# Patient Record
Sex: Female | Born: 1965 | Race: White | Hispanic: No | Marital: Single | Smoking: Current every day smoker
Health system: Southern US, Community
[De-identification: ages and names within clinical notes are randomized; demographics above are authoritative.]

## PROBLEM LIST (undated history)

## (undated) DIAGNOSIS — N200 Calculus of kidney: Secondary | ICD-10-CM

## (undated) DIAGNOSIS — J42 Unspecified chronic bronchitis: Secondary | ICD-10-CM

## (undated) DIAGNOSIS — J449 Chronic obstructive pulmonary disease, unspecified: Secondary | ICD-10-CM

## (undated) DIAGNOSIS — J189 Pneumonia, unspecified organism: Secondary | ICD-10-CM

## (undated) HISTORY — PX: LITHOTRIPSY: SUR834

## (undated) HISTORY — PX: ABDOMINAL HYSTERECTOMY: SHX81

---

## 2011-01-07 ENCOUNTER — Emergency Department (HOSPITAL_BASED_OUTPATIENT_CLINIC_OR_DEPARTMENT_OTHER)
Admission: EM | Admit: 2011-01-07 | Discharge: 2011-01-07 | Disposition: A | Discharge status: Home / Self Care | Payer: Self-pay | Attending: Emergency Medicine | Admitting: Emergency Medicine

## 2011-01-07 DIAGNOSIS — J449 Chronic obstructive pulmonary disease, unspecified: Secondary | ICD-10-CM | POA: Insufficient documentation

## 2011-01-07 DIAGNOSIS — J4489 Other specified chronic obstructive pulmonary disease: Secondary | ICD-10-CM | POA: Insufficient documentation

## 2011-01-07 DIAGNOSIS — F17200 Nicotine dependence, unspecified, uncomplicated: Secondary | ICD-10-CM | POA: Insufficient documentation

## 2011-01-07 DIAGNOSIS — L02419 Cutaneous abscess of limb, unspecified: Secondary | ICD-10-CM | POA: Insufficient documentation

## 2011-01-07 DIAGNOSIS — L03119 Cellulitis of unspecified part of limb: Secondary | ICD-10-CM | POA: Insufficient documentation

## 2012-07-29 ENCOUNTER — Emergency Department (HOSPITAL_BASED_OUTPATIENT_CLINIC_OR_DEPARTMENT_OTHER)

## 2012-07-29 ENCOUNTER — Encounter (HOSPITAL_BASED_OUTPATIENT_CLINIC_OR_DEPARTMENT_OTHER): Payer: Self-pay

## 2012-07-29 ENCOUNTER — Emergency Department (HOSPITAL_BASED_OUTPATIENT_CLINIC_OR_DEPARTMENT_OTHER)
Admission: EM | Admit: 2012-07-29 | Discharge: 2012-07-29 | Disposition: A | Discharge status: Home / Self Care | Attending: Emergency Medicine | Admitting: Emergency Medicine

## 2012-07-29 DIAGNOSIS — F17200 Nicotine dependence, unspecified, uncomplicated: Secondary | ICD-10-CM | POA: Insufficient documentation

## 2012-07-29 DIAGNOSIS — Z880 Allergy status to penicillin: Secondary | ICD-10-CM | POA: Insufficient documentation

## 2012-07-29 DIAGNOSIS — J40 Bronchitis, not specified as acute or chronic: Secondary | ICD-10-CM | POA: Insufficient documentation

## 2012-07-29 DIAGNOSIS — Z882 Allergy status to sulfonamides status: Secondary | ICD-10-CM | POA: Insufficient documentation

## 2012-07-29 HISTORY — DX: Unspecified chronic bronchitis: J42

## 2012-07-29 HISTORY — DX: Pneumonia, unspecified organism: J18.9

## 2012-07-29 MED ORDER — DEXAMETHASONE SODIUM PHOSPHATE 4 MG/ML IJ SOLN
10.0000 mg | Freq: Once | INTRAMUSCULAR | Status: AC
  Administered 2012-07-29: 10 mg via INTRAMUSCULAR
  Filled 2012-07-29: qty 3

## 2012-07-29 NOTE — ED Notes (Signed)
Prod cough "watery substance", wheezing x 4 days-was seen at [facility] ED 2 days ago-started on abx, inhaler, cough med-states she is no better

## 2012-07-29 NOTE — ED Provider Notes (Signed)
History     CSN: [id number]  Arrival date & time 07/29/12  1202   First MD Initiated Contact with Patient 07/29/12 1216      Chief Complaint  Patient presents with  . Cough    Patient went to the doctor the other day and got placed on a Z-Pak has been taking at  if not improved. Patient is a 46 y.o. female presenting with cough. The history is provided by the patient.  Cough The current episode started more than 2 days ago. The problem occurs every few minutes. The problem has not changed since onset.The cough is productive of sputum. There has been no fever. Pertinent negatives include no chest pain, no headaches and no myalgias. She has tried cough syrup for the symptoms. The treatment provided no relief. Her past medical history is significant for bronchitis. Her past medical history does not include pneumonia or emphysema.    Past Medical History  Diagnosis Date  . Bronchitis, chronic   . Pneumonia     Past Surgical History  Procedure Date  . Abdominal hysterectomy   . Cesarean section     No family history on file.  History  Substance Use Topics  . Smoking status: Current Every Day Smoker  . Smokeless tobacco: Not on file  . Alcohol Use: No    OB History    Grav Para Term Preterm Abortions TAB SAB Ect Mult Living                  Review of Systems  Respiratory: Positive for cough.   Cardiovascular: Negative for chest pain.  Musculoskeletal: Negative for myalgias.  Neurological: Negative for headaches.    Allergies  Penicillins and Sulfa antibiotics  Home Medications   Current Outpatient Rx  Name Route Sig Dispense Refill  . ALBUTEROL SULFATE IN Inhalation Inhale into the lungs.    . AZITHROMYCIN PO Oral Take by mouth.    Marland Kitchen HYDROCODONE-HOMATROPINE 5-1.5 MG/5ML PO SYRP Oral Take by mouth every 6 (six) hours as needed.      BP 128/81  Pulse 108  Temp 98.6 F (37 C) (Oral)  Resp 18  Ht 5\' 7"  (1.702 m)  Wt 175 lb (79.379 kg)  BMI 27.41 kg/m2   SpO2 93%  Physical Exam  Nursing note and vitals reviewed. Constitutional: She is oriented to person, place, and time. She appears well-developed. No distress.  HENT:  Head: Normocephalic and atraumatic.  Eyes: Pupils are equal, round, and reactive to light.  Neck: Normal range of motion.  Cardiovascular: Normal rate and intact distal pulses.   Pulmonary/Chest: No respiratory distress. She has wheezes.  Abdominal: Normal appearance. She exhibits no distension.  Musculoskeletal: Normal range of motion.  Neurological: She is alert and oriented to person, place, and time. No cranial nerve deficit.  Skin: Skin is warm and dry. No rash noted.  Psychiatric: She has a normal mood and affect. Her behavior is normal.    ED Course  Procedures (including critical care time)   Scheduled Meds:   . dexamethasone  10 mg Intramuscular Once   Continuous Infusions:  PRN Meds:.  Labs Reviewed - No data to display Dg Chest 2 View  07/29/2012  *RADIOLOGY REPORT*  Clinical Data: Cough, congestion.  CHEST - 2 VIEW  Comparison: None  Findings: Lungs hyperinflated. Heart and mediastinal contours are within normal limits.  No focal opacities or effusions.  No acute bony abnormality.  IMPRESSION: Mild hyperinflation.  No active disease.   Original  Report Authenticated By: Cyndie Chime, M.D.      1. Bronchitis       MDM         Nelia Shi, MD 07/29/12 [phone]

## 2013-08-07 ENCOUNTER — Emergency Department (HOSPITAL_BASED_OUTPATIENT_CLINIC_OR_DEPARTMENT_OTHER)
Admission: EM | Admit: 2013-08-07 | Discharge: 2013-08-07 | Disposition: A | Discharge status: Home / Self Care | Attending: Emergency Medicine | Admitting: Emergency Medicine

## 2013-08-07 ENCOUNTER — Encounter (HOSPITAL_BASED_OUTPATIENT_CLINIC_OR_DEPARTMENT_OTHER): Payer: Self-pay | Admitting: Emergency Medicine

## 2013-08-07 DIAGNOSIS — M545 Low back pain, unspecified: Secondary | ICD-10-CM | POA: Insufficient documentation

## 2013-08-07 DIAGNOSIS — R109 Unspecified abdominal pain: Secondary | ICD-10-CM | POA: Insufficient documentation

## 2013-08-07 DIAGNOSIS — F17200 Nicotine dependence, unspecified, uncomplicated: Secondary | ICD-10-CM | POA: Insufficient documentation

## 2013-08-07 DIAGNOSIS — J029 Acute pharyngitis, unspecified: Secondary | ICD-10-CM | POA: Insufficient documentation

## 2013-08-07 DIAGNOSIS — Z880 Allergy status to penicillin: Secondary | ICD-10-CM | POA: Insufficient documentation

## 2013-08-07 DIAGNOSIS — Z8701 Personal history of pneumonia (recurrent): Secondary | ICD-10-CM | POA: Insufficient documentation

## 2013-08-07 DIAGNOSIS — Z792 Long term (current) use of antibiotics: Secondary | ICD-10-CM | POA: Insufficient documentation

## 2013-08-07 DIAGNOSIS — H9209 Otalgia, unspecified ear: Secondary | ICD-10-CM | POA: Insufficient documentation

## 2013-08-07 DIAGNOSIS — H9203 Otalgia, bilateral: Secondary | ICD-10-CM

## 2013-08-07 DIAGNOSIS — Z90710 Acquired absence of both cervix and uterus: Secondary | ICD-10-CM | POA: Insufficient documentation

## 2013-08-07 LAB — URINALYSIS, ROUTINE W REFLEX MICROSCOPIC
Leukocytes, UA: NEGATIVE
Nitrite: NEGATIVE
Specific Gravity, Urine: 1.016 (ref 1.005–1.030)
Urobilinogen, UA: 0.2 mg/dL (ref 0.0–1.0)

## 2013-08-07 NOTE — ED Provider Notes (Addendum)
CSN: [id number]     Arrival date & time 08/07/13  1751 History   First MD Initiated Contact with Patient 08/07/13 1806     This chart was scribed for Doug Sou, MD by Ladona Ridgel Day, ED scribe. This patient was seen in room MH10/MH10 and the patient's care was started at 1751.  Chief Complaint  Patient presents with  . Otalgia  . Flank Pain   Patient is a 47 y.o. female presenting with ear pain and flank pain. The history is provided by the patient. No language interpreter was used.  Otalgia Location:  Bilateral Behind ear:  No abnormality Quality:  Aching Severity:  Moderate Duration:  2 weeks Timing:  Intermittent Progression:  Unchanged Context: not direct blow   Relieved by:  Nothing Worsened by:  Nothing tried Associated symptoms: abdominal pain (abdominal cramping) and sore throat   Associated symptoms: no fever and no vomiting   Flank Pain Associated symptoms include abdominal pain (abdominal cramping). Pertinent negatives include no shortness of breath.   HPI Comments: Kirsten Garcia is a 47 y.o. female who presents to the Emergency Department complaining of intermittent bilateral ear pain for 2 weeks. She states the pain radiates the right anterior aspect of her neck and also w/a mildly sore throat; no pain w/swallowing. She has not yet seen anyone for this problem. She denies associated emesis episodes. She also c/o right lower back pain, nothing make pain worse but she stats tylenol is controlling her pain symptoms.  She states cramping in her abdomen, she has prev hysterectomy. She denies fever/chills, SOB, CP. She states med hx of pneumonia, hysterectomy, bronchitis, bronchal asthma, kidney stone, c-section She is a smoker and baseline smokers cough, no ETOH, no illicit drug use. She uses Albuterol for bronchitis Allergic to PCN and sulfa PCP [facility]  Past Medical History  Diagnosis Date  . Bronchitis, chronic   . Pneumonia    Past Surgical History   Procedure Laterality Date  . Abdominal hysterectomy    . Cesarean section     History reviewed. No pertinent family history. History  Substance Use Topics  . Smoking status: Current Every Day Smoker  . Smokeless tobacco: Not on file  . Alcohol Use: No   OB History   Grav Para Term Preterm Abortions TAB SAB Ect Mult Living                 Review of Systems  Constitutional: Negative.  Negative for fever and chills.  HENT: Positive for ear pain and sore throat. Negative for trouble swallowing.   Respiratory: Negative.  Negative for shortness of breath.   Cardiovascular: Negative.   Gastrointestinal: Positive for abdominal pain (abdominal cramping). Negative for nausea and vomiting.  Genitourinary: Positive for flank pain.  Musculoskeletal: Negative.        Right flank pain.   Skin: Negative.   Neurological: Negative.  Negative for weakness.  Psychiatric/Behavioral: Negative.   All other systems reviewed and are negative.   A complete 10 system review of systems was obtained and all systems are negative except as noted in the HPI and PMH.   Allergies  Penicillins and Sulfa antibiotics  Home Medications   Current Outpatient Rx  Name  Route  Sig  Dispense  Refill  . ALBUTEROL SULFATE IN   Inhalation   Inhale into the lungs.         . AZITHROMYCIN PO   Oral   Take by mouth.         Marland Kitchen  HYDROcodone-homatropine (HYCODAN) 5-1.5 MG/5ML syrup   Oral   Take by mouth every 6 (six) hours as needed.          Triage Vitals: BP 143/89  Pulse 91  Temp(Src) 98.1 F (36.7 C) (Oral)  Resp 16  SpO2 100% Physical Exam  Nursing note and vitals reviewed. Constitutional: She is oriented to person, place, and time. She appears well-developed and well-nourished. No distress.  HENT:  Head: Normocephalic and atraumatic.  Throat minimally erythematous.   Eyes: Conjunctivae and EOM are normal. Pupils are equal, round, and reactive to light.  Neck: Neck supple. No tracheal  deviation present. No thyromegaly present.  Cardiovascular: Normal rate and regular rhythm.   No murmur heard. Pulmonary/Chest: Effort normal. No respiratory distress.  Scant rhonchi.   Abdominal: Soft. Bowel sounds are normal. She exhibits no distension. There is no tenderness.  Musculoskeletal: Normal range of motion. She exhibits no edema and no tenderness.  Neurological: She is alert and oriented to person, place, and time. Coordination normal.  Skin: Skin is warm and dry. No rash noted.  Psychiatric: She has a normal mood and affect. Her behavior is normal.    ED Course  Procedures (including critical care time) DIAGNOSTIC STUDIES: Oxygen Saturation is 100% on room air, normal by my interpretation.    COORDINATION OF CARE: At 640 PM Discussed treatment plan with patient which includes UA. Patient agrees.   Labs Review Labs Reviewed  URINALYSIS, ROUTINE W REFLEX MICROSCOPIC   Results for orders placed during the hospital encounter of 08/07/13  URINALYSIS, ROUTINE W REFLEX MICROSCOPIC      Result Value Range   Color, Urine YELLOW  YELLOW   APPearance CLEAR  CLEAR   Specific Gravity, Urine 1.016  1.005 - 1.030   pH 6.0  5.0 - 8.0   Glucose, UA NEGATIVE  NEGATIVE mg/dL   Hgb urine dipstick NEGATIVE  NEGATIVE   Bilirubin Urine NEGATIVE  NEGATIVE   Ketones, ur NEGATIVE  NEGATIVE mg/dL   Protein, ur NEGATIVE  NEGATIVE mg/dL   Urobilinogen, UA 0.2  0.0 - 1.0 mg/dL   Nitrite NEGATIVE  NEGATIVE   Leukocytes, UA NEGATIVE  NEGATIVE   No results found.  Imaging Review No results found.  MDM  No diagnosis found.  Patient in no distress, well appearing. Symptoms may be viral in etiology with mild pharyngeal erythema. Abdominal exam completely benign. Plan Tylenol for pain followup PMD if not better in a few days Diagnosis #1 otalgia #2 flank pain I personally performed the services described in this documentation, which was scribed in my presence. The recorded information has  been reviewed and considered.    Doug Sou, MD 08/07/13 1610  Doug Sou, MD 08/07/13 [id number]

## 2013-08-07 NOTE — ED Notes (Signed)
Pain in ears bilaterally for couple of weeks, states now pain moving down neck and headache, also c/o right flank pain

## 2013-09-28 ENCOUNTER — Encounter (HOSPITAL_COMMUNITY): Payer: Self-pay | Admitting: Emergency Medicine

## 2013-09-28 ENCOUNTER — Emergency Department (HOSPITAL_COMMUNITY)

## 2013-09-28 ENCOUNTER — Emergency Department (HOSPITAL_COMMUNITY)
Admission: EM | Admit: 2013-09-28 | Discharge: 2013-09-28 | Disposition: A | Discharge status: Home / Self Care | Attending: Emergency Medicine | Admitting: Emergency Medicine

## 2013-09-28 DIAGNOSIS — R062 Wheezing: Secondary | ICD-10-CM | POA: Insufficient documentation

## 2013-09-28 DIAGNOSIS — Z882 Allergy status to sulfonamides status: Secondary | ICD-10-CM | POA: Insufficient documentation

## 2013-09-28 DIAGNOSIS — R0682 Tachypnea, not elsewhere classified: Secondary | ICD-10-CM | POA: Insufficient documentation

## 2013-09-28 DIAGNOSIS — Z79899 Other long term (current) drug therapy: Secondary | ICD-10-CM | POA: Insufficient documentation

## 2013-09-28 DIAGNOSIS — Z8709 Personal history of other diseases of the respiratory system: Secondary | ICD-10-CM | POA: Insufficient documentation

## 2013-09-28 DIAGNOSIS — J4489 Other specified chronic obstructive pulmonary disease: Secondary | ICD-10-CM | POA: Insufficient documentation

## 2013-09-28 DIAGNOSIS — F17200 Nicotine dependence, unspecified, uncomplicated: Secondary | ICD-10-CM | POA: Insufficient documentation

## 2013-09-28 DIAGNOSIS — R0609 Other forms of dyspnea: Secondary | ICD-10-CM | POA: Insufficient documentation

## 2013-09-28 DIAGNOSIS — R059 Cough, unspecified: Secondary | ICD-10-CM | POA: Insufficient documentation

## 2013-09-28 DIAGNOSIS — Z8701 Personal history of pneumonia (recurrent): Secondary | ICD-10-CM | POA: Insufficient documentation

## 2013-09-28 DIAGNOSIS — Z880 Allergy status to penicillin: Secondary | ICD-10-CM | POA: Insufficient documentation

## 2013-09-28 DIAGNOSIS — R000 Tachycardia, unspecified: Secondary | ICD-10-CM | POA: Insufficient documentation

## 2013-09-28 DIAGNOSIS — J449 Chronic obstructive pulmonary disease, unspecified: Secondary | ICD-10-CM | POA: Insufficient documentation

## 2013-09-28 DIAGNOSIS — R079 Chest pain, unspecified: Secondary | ICD-10-CM | POA: Insufficient documentation

## 2013-09-28 DIAGNOSIS — R05 Cough: Secondary | ICD-10-CM | POA: Insufficient documentation

## 2013-09-28 DIAGNOSIS — R0600 Dyspnea, unspecified: Secondary | ICD-10-CM

## 2013-09-28 DIAGNOSIS — R9431 Abnormal electrocardiogram [ECG] [EKG]: Secondary | ICD-10-CM | POA: Insufficient documentation

## 2013-09-28 DIAGNOSIS — R0989 Other specified symptoms and signs involving the circulatory and respiratory systems: Secondary | ICD-10-CM | POA: Insufficient documentation

## 2013-09-28 HISTORY — DX: Chronic obstructive pulmonary disease, unspecified: J44.9

## 2013-09-28 LAB — POCT I-STAT TROPONIN I: Troponin i, poc: 0 ng/mL (ref 0.00–0.08)

## 2013-09-28 LAB — D-DIMER, QUANTITATIVE: D-Dimer, Quant: 0.36 ug/mL-FEU (ref 0.00–0.48)

## 2013-09-28 LAB — BASIC METABOLIC PANEL
CO2: 26 mEq/L (ref 19–32)
Calcium: 9.7 mg/dL (ref 8.4–10.5)
Chloride: 103 mEq/L (ref 96–112)
Potassium: 4.1 mEq/L (ref 3.5–5.1)
Sodium: 139 mEq/L (ref 135–145)

## 2013-09-28 LAB — CBC
Platelets: 230 10*3/uL (ref 150–400)
RBC: 5.42 MIL/uL — ABNORMAL HIGH (ref 3.87–5.11)
WBC: 8.3 10*3/uL (ref 4.0–10.5)

## 2013-09-28 MED ORDER — ALBUTEROL SULFATE (5 MG/ML) 0.5% IN NEBU
5.0000 mg | INHALATION_SOLUTION | Freq: Once | RESPIRATORY_TRACT | Status: AC
  Administered 2013-09-28: 5 mg via RESPIRATORY_TRACT
  Filled 2013-09-28: qty 1

## 2013-09-28 MED ORDER — ALBUTEROL (5 MG/ML) CONTINUOUS INHALATION SOLN
10.0000 mg/h | INHALATION_SOLUTION | RESPIRATORY_TRACT | Status: AC
  Administered 2013-09-28: 10 mg/h via RESPIRATORY_TRACT
  Filled 2013-09-28: qty 20

## 2013-09-28 MED ORDER — PREDNISONE 10 MG PO TABS: 40.0000 mg | ORAL_TABLET | Freq: Every day | ORAL | Status: AC

## 2013-09-28 MED ORDER — ALBUTEROL SULFATE HFA 108 (90 BASE) MCG/ACT IN AERS: 2.0000 | INHALATION_SPRAY | RESPIRATORY_TRACT | Status: AC

## 2013-09-28 MED ORDER — METHYLPREDNISOLONE SODIUM SUCC 125 MG IJ SOLR
125.0000 mg | INTRAMUSCULAR | Status: AC
  Administered 2013-09-28: 125 mg via INTRAVENOUS
  Filled 2013-09-28: qty 2

## 2013-09-28 NOTE — ED Notes (Signed)
Patient transported to X-ray 

## 2013-09-28 NOTE — ED Notes (Signed)
Pt returned to exam room from xray 

## 2013-09-28 NOTE — ED Provider Notes (Signed)
CSN: [id number]     Arrival date & time 09/28/13  4098 History   First MD Initiated Contact with Patient 09/28/13 [phone]     Chief Complaint  Patient presents with  . Shortness of Breath   (Consider location/radiation/quality/duration/timing/severity/associated sxs/prior Treatment) HPI Patient presents with dyspnea, cough.  Notably, the patient initiated treatment 1 week ago for an episode of bronchitis.  She states that she has a history of chronic bronchitis, continue to smoke regardless. She states over the past day she has had increasing cough, increasing dyspnea with chest pain associated with coughing. No no syncope, vomiting, diarrhea, abdominal pain, nausea. No confusion, no disorientation, no rash, no edema anywhere.  Past Medical History  Diagnosis Date  . Bronchitis, chronic   . Pneumonia   . COPD (chronic obstructive pulmonary disease)    Past Surgical History  Procedure Laterality Date  . Abdominal hysterectomy    . Cesarean section     History reviewed. No pertinent family history. History  Substance Use Topics  . Smoking status: Current Every Day Smoker  . Smokeless tobacco: Not on file  . Alcohol Use: No   OB History   Grav Para Term Preterm Abortions TAB SAB Ect Mult Living                 Review of Systems  Constitutional:       Per HPI, otherwise negative  HENT:       Per HPI, otherwise negative  Respiratory:       Per HPI, otherwise negative  Cardiovascular:       Per HPI, otherwise negative  Gastrointestinal: Negative for vomiting.  Endocrine:       Negative aside from HPI  Genitourinary:       Neg aside from HPI   Musculoskeletal:       Per HPI, otherwise negative  Skin: Negative.   Neurological: Negative for syncope.    Allergies  Penicillins and Sulfa antibiotics  Home Medications   Current Outpatient Rx  Name  Route  Sig  Dispense  Refill  . albuterol (PROAIR HFA) 108 (90 BASE) MCG/ACT inhaler   Inhalation   Inhale 2 puffs into  the lungs every 6 (six) hours as needed for wheezing or shortness of breath (wheezing).         Marland Kitchen HYDROcodone-homatropine (HYCODAN) 5-1.5 MG/5ML syrup   Oral   Take by mouth every 6 (six) hours as needed.         . PrednisoLONE 5 MG KIT   Oral   Take by mouth. Pt finished on 09-26-13 for 6 days          BP 119/77  Pulse 76  Temp(Src) 98.3 F (36.8 C) (Oral)  Resp 18  Ht 5\' 7"  (1.702 m)  Wt 188 lb 12.8 oz (85.639 kg)  BMI 29.56 kg/m2  SpO2 94% Physical Exam  Nursing note and vitals reviewed. Constitutional: She is oriented to person, place, and time. She appears well-developed and well-nourished. No distress.  HENT:  Head: Normocephalic and atraumatic.  Eyes: Conjunctivae and EOM are normal.  Cardiovascular: Regular rhythm.  Tachycardia present.   Pulmonary/Chest: No stridor. Tachypnea noted. No respiratory distress. She has wheezes.  Abdominal: She exhibits no distension.  Musculoskeletal: She exhibits no edema and no tenderness.  Neurological: She is alert and oriented to person, place, and time. No cranial nerve deficit.  Skin: Skin is warm and dry.  Psychiatric: She has a normal mood and affect.    ED  Course  Procedures (including critical care time) Labs Review Labs Reviewed  CBC - Abnormal; Notable for the following:    RBC 5.42 (*)    Hemoglobin 16.9 (*)    HCT 48.9 (*)    All other components within normal limits  BASIC METABOLIC PANEL - Abnormal; Notable for the following:    GFR calc non Af Amer 72 (*)    GFR calc Af Amer 84 (*)    All other components within normal limits  D-DIMER, QUANTITATIVE  POCT I-STAT TROPONIN I   Imaging Review Dg Chest 2 View  09/28/2013   CLINICAL DATA:  Cough and shortness of breath.  EXAM: CHEST  2 VIEW  COMPARISON:  09/21/2013  FINDINGS: There has been marked decrease conspicuity of the previously described interstitial findings. No new focal reason consolidation are new focal infiltrates. Cardiac silhouette visualized  bony skeleton unremarkable. The lungs are hyperinflated.  IMPRESSION: 1. Findings consistent with resolving previously described interstitial changes. 2. Continued hyperinflation 3. No focal regions of consolidation or focal infiltrates   Electronically Signed   By: Salome Holmes M.D.   On: 09/28/2013 10:23    EKG Interpretation    Date/Time:  Tuesday September 28 2013 09:25:05 EST Ventricular Rate:  115 PR Interval:  130 QRS Duration: 72 QT Interval:  318 QTC Calculation: 439 R Axis:   62 Text Interpretation:  Sinus tachycardia Biatrial enlargement Abnormal ECG Sinus tachycardia enlarged p waves Abnormal ekg Confirmed by Gerhard Munch  MD [phone]) on 09/28/2013 11:09:28 AM           Update: Patient is now feeling better.  I discussed the results with her.  She'll have additional breathing treatment, posterior, be reassessed  12:19 PM Patient states that she is better. BS remain coarse.  Continuous ordered  2:39 PM Following continuous nebulizer, the patient looks on for substantially better, but not to without wheezing.  We had a lengthy conversation about utility of additional therapy here, admission versus outpatient management.  Patient requested a preference for outpatient management.  Given her stable vital signs, this seems reasonable MDM  No diagnosis found. Patient presents with dyspnea following recent episode of proctitis, possible URI.  On exam she is awake alert, afebrile, tachycardic, with coarse breath sounds.  Patient improved substantially here.  With ongoing mild wheezing, her history, her continued use of cigarettes, she was discharged with scheduled albuterol therapy, steroids.  Patient was counseled multiple times on the need to stop smoking.    Gerhard Munch, MD 09/28/13 [phone]

## 2013-09-28 NOTE — ED Notes (Signed)
Pt reports her doctor placed on zpak and steroid last week for cough and bronchitis. She finished the full course but continues to feel SOB with a cough and "today it felt like my heart was racing and I feel numb in my back." pt is able to speak in full unlabored sentences but does have a rhoncorus cough.

## 2014-05-06 ENCOUNTER — Encounter (HOSPITAL_BASED_OUTPATIENT_CLINIC_OR_DEPARTMENT_OTHER): Payer: Self-pay | Admitting: Emergency Medicine

## 2014-05-06 ENCOUNTER — Emergency Department (HOSPITAL_BASED_OUTPATIENT_CLINIC_OR_DEPARTMENT_OTHER)
Admission: EM | Admit: 2014-05-06 | Discharge: 2014-05-06 | Disposition: A | Discharge status: Home / Self Care | Attending: Emergency Medicine | Admitting: Emergency Medicine

## 2014-05-06 DIAGNOSIS — IMO0002 Reserved for concepts with insufficient information to code with codable children: Secondary | ICD-10-CM | POA: Insufficient documentation

## 2014-05-06 DIAGNOSIS — Z8709 Personal history of other diseases of the respiratory system: Secondary | ICD-10-CM | POA: Insufficient documentation

## 2014-05-06 DIAGNOSIS — Z880 Allergy status to penicillin: Secondary | ICD-10-CM | POA: Insufficient documentation

## 2014-05-06 DIAGNOSIS — F17200 Nicotine dependence, unspecified, uncomplicated: Secondary | ICD-10-CM | POA: Insufficient documentation

## 2014-05-06 DIAGNOSIS — R000 Tachycardia, unspecified: Secondary | ICD-10-CM | POA: Insufficient documentation

## 2014-05-06 DIAGNOSIS — Z8701 Personal history of pneumonia (recurrent): Secondary | ICD-10-CM | POA: Insufficient documentation

## 2014-05-06 DIAGNOSIS — Z79899 Other long term (current) drug therapy: Secondary | ICD-10-CM | POA: Insufficient documentation

## 2014-05-06 DIAGNOSIS — B029 Zoster without complications: Secondary | ICD-10-CM | POA: Insufficient documentation

## 2014-05-06 DIAGNOSIS — IMO0001 Reserved for inherently not codable concepts without codable children: Secondary | ICD-10-CM | POA: Insufficient documentation

## 2014-05-06 MED ORDER — PREDNISONE 20 MG PO TABS: 40.0000 mg | ORAL_TABLET | Freq: Every day | ORAL | Status: DC

## 2014-05-06 MED ORDER — VALACYCLOVIR HCL 1 G PO TABS: 1000.0000 mg | ORAL_TABLET | Freq: Three times a day (TID) | ORAL | Status: AC

## 2014-05-06 NOTE — Discharge Instructions (Signed)
Shingles °Shingles (herpes zoster) is an infection that is caused by the same virus that causes chickenpox (varicella). The infection causes a painful skin rash and fluid-filled blisters, which eventually break open, crust over, and heal. It may occur in any area of the body, but it usually affects only one side of the body or face. The pain of shingles usually lasts about 1 month. However, some people with shingles may develop long-term (chronic) pain in the affected area of the body. °Shingles often occurs many years after the person had chickenpox. It is more common: °· In people older than 50 years. °· In people with weakened immune systems, such as those with HIV, AIDS, or cancer. °· In people taking medicines that weaken the immune system, such as transplant medicines. °· In people under great stress. °CAUSES  °Shingles is caused by the varicella zoster virus (VZV), which also causes chickenpox. After a person is infected with the virus, it can remain in the person's body for years in an inactive state (dormant). To cause shingles, the virus reactivates and breaks out as an infection in a nerve root. °The virus can be spread from person to person (contagious) through contact with open blisters of the shingles rash. It will only spread to people who have not had chickenpox. When these people are exposed to the virus, they may develop chickenpox. They will not develop shingles. Once the blisters scab over, the person is no longer contagious and cannot spread the virus to others. °SYMPTOMS  °Shingles shows up in stages. The initial symptoms may be pain, itching, and tingling in an area of the skin. This pain is usually described as burning, stabbing, or throbbing. In a few days or weeks, a painful red rash will appear in the area where the pain, itching, and tingling were felt. The rash is usually on one side of the body in a band or belt-like pattern. Then, the rash usually turns into fluid-filled blisters. They  will scab over and dry up in approximately 2-3 weeks. °Flu-like symptoms may also occur with the initial symptoms, the rash, or the blisters. These may include: °· Fever. °· Chills. °· Headache. °· Upset stomach. °DIAGNOSIS  °Your caregiver will perform a skin exam to diagnose shingles. Skin scrapings or fluid samples may also be taken from the blisters. This sample will be examined under a microscope or sent to a lab for further testing. °TREATMENT  °There is no specific cure for shingles. Your caregiver will likely prescribe medicines to help you manage the pain, recover faster, and avoid long-term problems. This may include antiviral drugs, anti-inflammatory drugs, and pain medicines. °HOME CARE INSTRUCTIONS  °· Take a cool bath or apply cool compresses to the area of the rash or blisters as directed. This may help with the pain and itching.   °· Only take over-the-counter or prescription medicines as directed by your caregiver.   °· Rest as directed by your caregiver. °· Keep your rash and blisters clean with mild soap and cool water or as directed by your caregiver.  °· Do not pick your blisters or scratch your rash. Apply an anti-itch cream or numbing creams to the affected area as directed by your caregiver. °· Keep your shingles rash covered with a loose bandage (dressing). °· Avoid skin contact with: °¨ Babies.   °¨ Pregnant women.   °¨ Children with eczema.   °¨ Elderly people with transplants.   °¨ People with chronic illnesses, such as leukemia or AIDS.   °· Wear loose-fitting clothing to help ease the   pain of material rubbing against the rash. °· Keep all follow-up appointments with your caregiver. If the area involved is on your face, you may receive a referral for follow-up to a specialist, such as an eye doctor (ophthalmologist) or an ear, nose, and throat (ENT) doctor. Keeping all follow-up appointments will help you avoid eye complications, chronic pain, or disability.   °SEEK IMMEDIATE MEDICAL  CARE IF:  °· You have facial pain, pain around the eye area, or loss of feeling on one side of your face. °· You have ear pain or ringing in your ear. °· You have loss of taste. °· Your pain is not relieved with prescribed medicines.   °· Your redness or swelling spreads.   °· You have more pain and swelling.  °· Your condition is worsening or has changed.   °· You have a fever or persistent symptoms for more than 2-3 days. °· You have a fever and your symptoms suddenly get worse. °MAKE SURE YOU: °· Understand these instructions. °· Will watch your condition. °· Will get help right away if you are not doing well or get worse. °Document Released: 10/21/2005 Document Revised: 07/15/2012 Document Reviewed: 06/04/2012 °[employer]® Patient Information ©2015 [employer]. This information is not intended to replace advice given to you by your health care provider. Make sure you discuss any questions you have with your health care provider. ° °

## 2014-05-06 NOTE — ED Notes (Signed)
Blistery, painful rash on the left side of her x 2 days.

## 2014-05-06 NOTE — ED Provider Notes (Signed)
CSN: [id number]     Arrival date & time 05/06/14  1100 History   First MD Initiated Contact with Patient 05/06/14 1132     Chief Complaint  Patient presents with  . Rash     (Consider location/radiation/quality/duration/timing/severity/associated sxs/prior Treatment) Patient is a [age] y.o. female presenting with rash. The history is provided by the patient.  Rash Location:  Face Facial rash location:  Face Quality: blistering, painful, redness and swelling   Pain details:    Quality:  Sharp and stinging   Severity:  Moderate   Onset quality:  Gradual   Duration:  5 days   Timing:  Constant   Progression:  Unchanged Severity:  Moderate Onset quality:  Gradual Timing:  Constant Chronicity:  New Context: not sick contacts   Relieved by:  None tried Worsened by:  Nothing tried Ineffective treatments:  None tried Associated symptoms: myalgias   Associated symptoms: no fever, no headaches, no throat swelling and no tongue swelling     Past Medical History  Diagnosis Date  . Bronchitis, chronic   . Pneumonia   . COPD (chronic obstructive pulmonary disease)    Past Surgical History  Procedure Laterality Date  . Abdominal hysterectomy    . Cesarean section     No family history on file. History  Substance Use Topics  . Smoking status: Current Every Day Smoker -- 0.50 packs/day    Types: Cigarettes  . Smokeless tobacco: Not on file  . Alcohol Use: No   OB History   Grav Para Term Preterm Abortions TAB SAB Ect Mult Living                 Review of Systems  Constitutional: Negative for fever.  Musculoskeletal: Positive for myalgias.  Skin: Positive for rash.  Neurological: Negative for headaches.  All other systems reviewed and are negative.     Allergies  Penicillins and Sulfa antibiotics  Home Medications   Prior to Admission medications   Medication Sig Start Date End Date Taking? Authorizing Provider  ALBUTEROL IN Inhale into the lungs.   Yes Historical  Provider, MD  tiotropium (SPIRIVA) 18 MCG inhalation capsule Place 18 mcg into inhaler and inhale daily.   Yes Historical Provider, MD  HYDROcodone-homatropine (HYCODAN) 5-1.5 MG/5ML syrup Take by mouth every 6 (six) hours as needed.    Historical Provider, MD  predniSONE (DELTASONE) 20 MG tablet Take 2 tablets (40 mg total) by mouth daily. 05/06/14   Gwyneth SproutWhitney Doreather Hoxworth, MD  valACYclovir (VALTREX) 1000 MG tablet Take 1 tablet (1,000 mg total) by mouth 3 (three) times daily. 05/06/14 05/20/14  Gwyneth SproutWhitney Shatora Weatherbee, MD   BP 117/79  Pulse 117  Temp(Src) 98.7 F (37.1 C) (Oral)  Resp 14  Ht 5\' 7"  (1.702 m)  Wt 187 lb (84.823 kg)  BMI 29.28 kg/m2  SpO2 98% Physical Exam  Nursing note and vitals reviewed. Constitutional: She is oriented to person, place, and time. She appears well-developed and well-nourished. No distress.  HENT:  Head: Normocephalic.    Right Ear: Tympanic membrane and ear canal normal.  Left Ear: Tympanic membrane and ear canal normal.  Eyes: EOM are normal. Pupils are equal, round, and reactive to light.  Cardiovascular: Tachycardia present.   Pulmonary/Chest: Effort normal.  Neurological: She is alert and oriented to person, place, and time.  Skin: Skin is warm. Rash noted. Rash is vesicular.  Psychiatric: She has a normal mood and affect. Her behavior is normal.    ED Course  Procedures (including  critical care time) Labs Review Labs Reviewed - No data to display  Imaging Review No results found.   EKG Interpretation None      MDM   Final diagnoses:  Herpes zoster    Pt with evidence of shingles in C2 dermatome.  No ear involvement.  No other systemic sx.  Currently not on immunemodulator meds or chemo.  Does use inhaled steroids for COPD.  Pt treated with valtrex and steroids.    Gwyneth SproutWhitney Ruford Dudzinski, MD 05/06/14 1209

## 2014-09-11 ENCOUNTER — Emergency Department (HOSPITAL_BASED_OUTPATIENT_CLINIC_OR_DEPARTMENT_OTHER)
Admission: EM | Admit: 2014-09-11 | Discharge: 2014-09-11 | Disposition: A | Discharge status: Home / Self Care | Attending: Emergency Medicine | Admitting: Emergency Medicine

## 2014-09-11 ENCOUNTER — Emergency Department (HOSPITAL_BASED_OUTPATIENT_CLINIC_OR_DEPARTMENT_OTHER)

## 2014-09-11 ENCOUNTER — Encounter (HOSPITAL_BASED_OUTPATIENT_CLINIC_OR_DEPARTMENT_OTHER): Payer: Self-pay | Admitting: *Deleted

## 2014-09-11 DIAGNOSIS — R059 Cough, unspecified: Secondary | ICD-10-CM

## 2014-09-11 DIAGNOSIS — R42 Dizziness and giddiness: Secondary | ICD-10-CM | POA: Diagnosis not present

## 2014-09-11 DIAGNOSIS — J441 Chronic obstructive pulmonary disease with (acute) exacerbation: Secondary | ICD-10-CM | POA: Diagnosis not present

## 2014-09-11 DIAGNOSIS — M791 Myalgia: Secondary | ICD-10-CM | POA: Diagnosis not present

## 2014-09-11 DIAGNOSIS — Z87442 Personal history of urinary calculi: Secondary | ICD-10-CM | POA: Insufficient documentation

## 2014-09-11 DIAGNOSIS — Z720 Tobacco use: Secondary | ICD-10-CM | POA: Diagnosis not present

## 2014-09-11 DIAGNOSIS — Z880 Allergy status to penicillin: Secondary | ICD-10-CM | POA: Insufficient documentation

## 2014-09-11 DIAGNOSIS — Z7952 Long term (current) use of systemic steroids: Secondary | ICD-10-CM | POA: Insufficient documentation

## 2014-09-11 DIAGNOSIS — R05 Cough: Secondary | ICD-10-CM

## 2014-09-11 DIAGNOSIS — Z8701 Personal history of pneumonia (recurrent): Secondary | ICD-10-CM | POA: Diagnosis not present

## 2014-09-11 DIAGNOSIS — Z79899 Other long term (current) drug therapy: Secondary | ICD-10-CM | POA: Diagnosis not present

## 2014-09-11 HISTORY — DX: Calculus of kidney: N20.0

## 2014-09-11 MED ORDER — HYDROCOD POLST-CHLORPHEN POLST 10-8 MG/5ML PO LQCR: 5.0000 mL | Freq: Two times a day (BID) | ORAL | Status: DC | PRN

## 2014-09-11 MED ORDER — PREDNISONE 10 MG PO TABS
20.0000 mg | ORAL_TABLET | Freq: Every day | ORAL | Status: DC
Start: 2014-09-11 — End: 2018-01-07

## 2014-09-11 MED ORDER — AZITHROMYCIN 250 MG PO TABS: ORAL_TABLET | ORAL | Status: DC

## 2014-09-11 MED ORDER — IPRATROPIUM-ALBUTEROL 0.5-2.5 (3) MG/3ML IN SOLN
3.0000 mL | RESPIRATORY_TRACT | Status: DC
  Administered 2014-09-11: 3 mL via RESPIRATORY_TRACT
  Filled 2014-09-11: qty 3

## 2014-09-11 NOTE — ED Notes (Signed)
Cough, sore throat, aches, ear pain since friday

## 2014-09-11 NOTE — ED Provider Notes (Signed)
CSN: [id number]     Arrival date & time 09/11/14  1118 History   First MD Initiated Contact with Patient 09/11/14 1214     Chief Complaint  Patient presents with  . Cough     (Consider location/radiation/quality/duration/timing/severity/associated sxs/prior Treatment) Patient is a 48 y.o. female presenting with cough. The history is provided by the patient.  Cough Cough characteristics:  Productive Sputum characteristics:  Yellow Severity:  Moderate Onset quality:  Gradual Duration:  3 days Timing:  Constant Progression:  Worsening Relieved by:  Nothing Worsened by:  Lying down and smoking Ineffective treatments:  Beta-agonist inhaler, cough suppressants and ipratropium inhaler Associated symptoms: ear fullness, ear pain, fever, headaches, myalgias and sore throat   Associated symptoms: no rash    Kirsten Garcia is a 48 y.o. female who presents to the ED with cough, sore throat, ear ache and body aches that started 3 days ago. Patient states that she has COPD and 2 weeks ago she had an acute excerebration and saw her Pulmonary doctor and he put her on Levaquin and and a 12 day steroid taper. She was doing better until 3 days ago when she started coughing and wheezing again. She is using her inhalers without relief. She continues to smoke 1/2 PPD.    Past Medical History  Diagnosis Date  . Bronchitis, chronic   . Pneumonia   . COPD (chronic obstructive pulmonary disease)   . Kidney stone    Past Surgical History  Procedure Laterality Date  . Abdominal hysterectomy    . Cesarean section    . Lithotripsy     No family history on file. History  Substance Use Topics  . Smoking status: Current Every Day Smoker -- 0.50 packs/day    Types: Cigarettes  . Smokeless tobacco: Never Used  . Alcohol Use: No   OB History    No data available     Review of Systems  Constitutional: Positive for fever.  HENT: Positive for congestion, ear pain and sore throat. Negative for trouble  swallowing.   Eyes: Negative for pain and visual disturbance.  Respiratory: Positive for cough.   Gastrointestinal: Negative for vomiting, abdominal pain and diarrhea.  Genitourinary: Negative for dysuria, urgency and frequency.  Musculoskeletal: Positive for myalgias.  Skin: Negative for rash.  Neurological: Positive for light-headedness and headaches.  Psychiatric/Behavioral: Negative for confusion. The patient is not nervous/anxious.       Allergies  Penicillins and Sulfa antibiotics  Home Medications   Prior to Admission medications   Medication Sig Start Date End Date Taking? Authorizing Provider  ALBUTEROL IN Inhale into the lungs.   Yes Historical Provider, MD  Fluticasone Furoate-Vilanterol (BREO ELLIPTA) 100-25 MCG/INH AEPB Inhale into the lungs.   Yes Historical Provider, MD  tiotropium (SPIRIVA) 18 MCG inhalation capsule Place 18 mcg into inhaler and inhale daily.   Yes Historical Provider, MD  HYDROcodone-homatropine (HYCODAN) 5-1.5 MG/5ML syrup Take by mouth every 6 (six) hours as needed.    Historical Provider, MD  predniSONE (DELTASONE) 20 MG tablet Take 2 tablets (40 mg total) by mouth daily. 05/06/14   Gwyneth SproutWhitney Plunkett, MD   BP 128/79 mmHg  Pulse 106  Temp(Src) 98.7 F (37.1 C) (Oral)  Resp 20  Ht 5\' 7"  (1.702 m)  Wt 195 lb (88.451 kg)  BMI 30.53 kg/m2  SpO2 99% Physical Exam  Constitutional: She is oriented to person, place, and time. She appears well-developed and well-nourished. No distress.  HENT:  Head: Normocephalic.  Right Ear: Tympanic  membrane normal.  Left Ear: Tympanic membrane normal.  Nose: Rhinorrhea present.  Mouth/Throat: Uvula is midline and mucous membranes are normal. Posterior oropharyngeal erythema present.  Eyes: Conjunctivae and EOM are normal. Pupils are equal, round, and reactive to light.  Neck: Normal range of motion. Neck supple.  Pulmonary/Chest: She has decreased breath sounds. She has wheezes in the left upper field. She has  rhonchi.  Abdominal: Soft. There is no tenderness.  Musculoskeletal: Normal range of motion.  Lymphadenopathy:    She has no cervical adenopathy.  Neurological: She is alert and oriented to person, place, and time. No cranial nerve deficit.  Skin: Skin is warm and dry.  Psychiatric: She has a normal mood and affect. Her behavior is normal.  Nursing note and vitals reviewed.   ED Course  Procedures ( Dg Chest 2 View  09/11/2014   CLINICAL DATA:  Patient with cough, congestion and sore throat x several days. H/o pneumonia, bronchitis, copd and a smoker. Patient denies any surgeries,  EXAM: CHEST  2 VIEW  COMPARISON:  09/28/2013.  FINDINGS: The heart remains normal in size. The lungs remain mildly hyperexpanded with minimal central peribronchial thickening. Unremarkable bones.  IMPRESSION: Stable mild changes of COPD and chronic bronchitis. No acute abnormality.   Electronically Signed   By: Gordan PaymentSteve  Reid M.D.   On: 09/11/2014 12:57    Patient feeling better after breathing treatment.  MDM  I discussed this case with Dr. Judd Lienelo will treat patient with antibiotics and steroid burst, cough suppressant.  48 y.o. female with hx of COPD here with productive cough and congestion x 3 days without relief using her home meds. I have reviewed this patient's vital signs, nurses notes, appropriate imaging and discussed findings with the patient and plan of care. She voices understanding and agrees with plan. she will follow up with her PCP this week. She will return here as needed for worsening symptoms.    Medication List    STOP taking these medications        HYDROcodone-homatropine 5-1.5 MG/5ML syrup  Commonly known as:  HYCODAN      TAKE these medications        azithromycin 250 MG tablet  Commonly known as:  ZITHROMAX Z-PAK  Take 2 tablets now and then one tablet daily     chlorpheniramine-HYDROcodone 10-8 MG/5ML Lqcr  Commonly known as:  TUSSIONEX PENNKINETIC ER  Take 5 mLs by mouth every 12  (twelve) hours as needed for cough.     predniSONE 10 MG tablet  Commonly known as:  DELTASONE  Take 2 tablets (20 mg total) by mouth daily.      ASK your doctor about these medications        ALBUTEROL IN  Inhale into the lungs.     BREO ELLIPTA 100-25 MCG/INH Aepb  Generic drug:  Fluticasone Furoate-Vilanterol  Inhale into the lungs.     tiotropium 18 MCG inhalation capsule  Commonly known as:  SPIRIVA  Place 18 mcg into inhaler and inhale daily.             [address], NP 09/11/14 1342  Geoffery Lyonsouglas Delo, MD 09/12/14 [phone]

## 2015-07-07 IMAGING — CR DG CHEST 2V
2 series · 2 of 2 positions shown · non-contrast
Comparison: 09/28/2013.

CLINICAL DATA: Patient with cough, congestion and sore throat x
several days. H/o pneumonia, bronchitis, copd and a smoker. Patient
denies any surgeries,

EXAM:
CHEST  2 VIEW

[w chest pa]
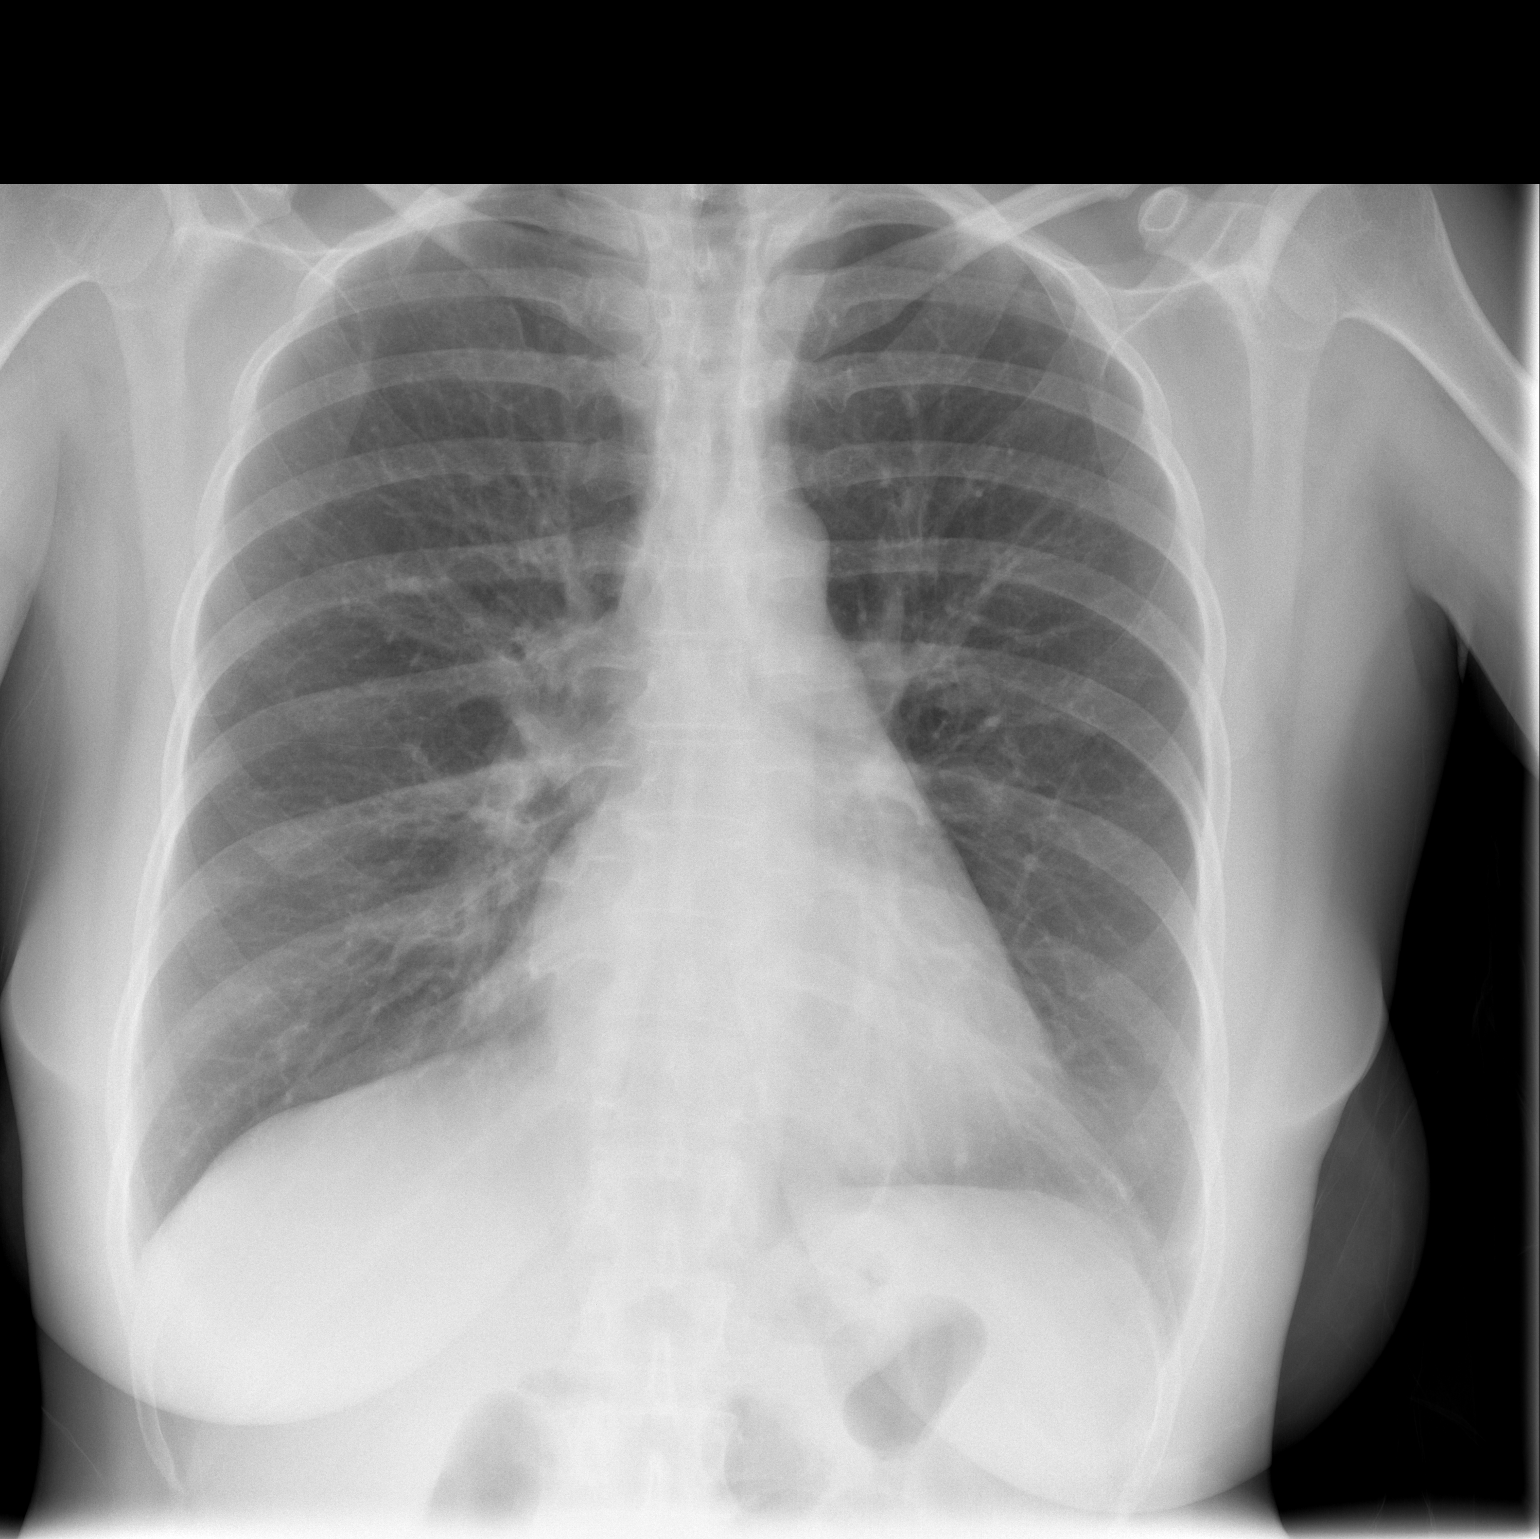

[w chest lat]
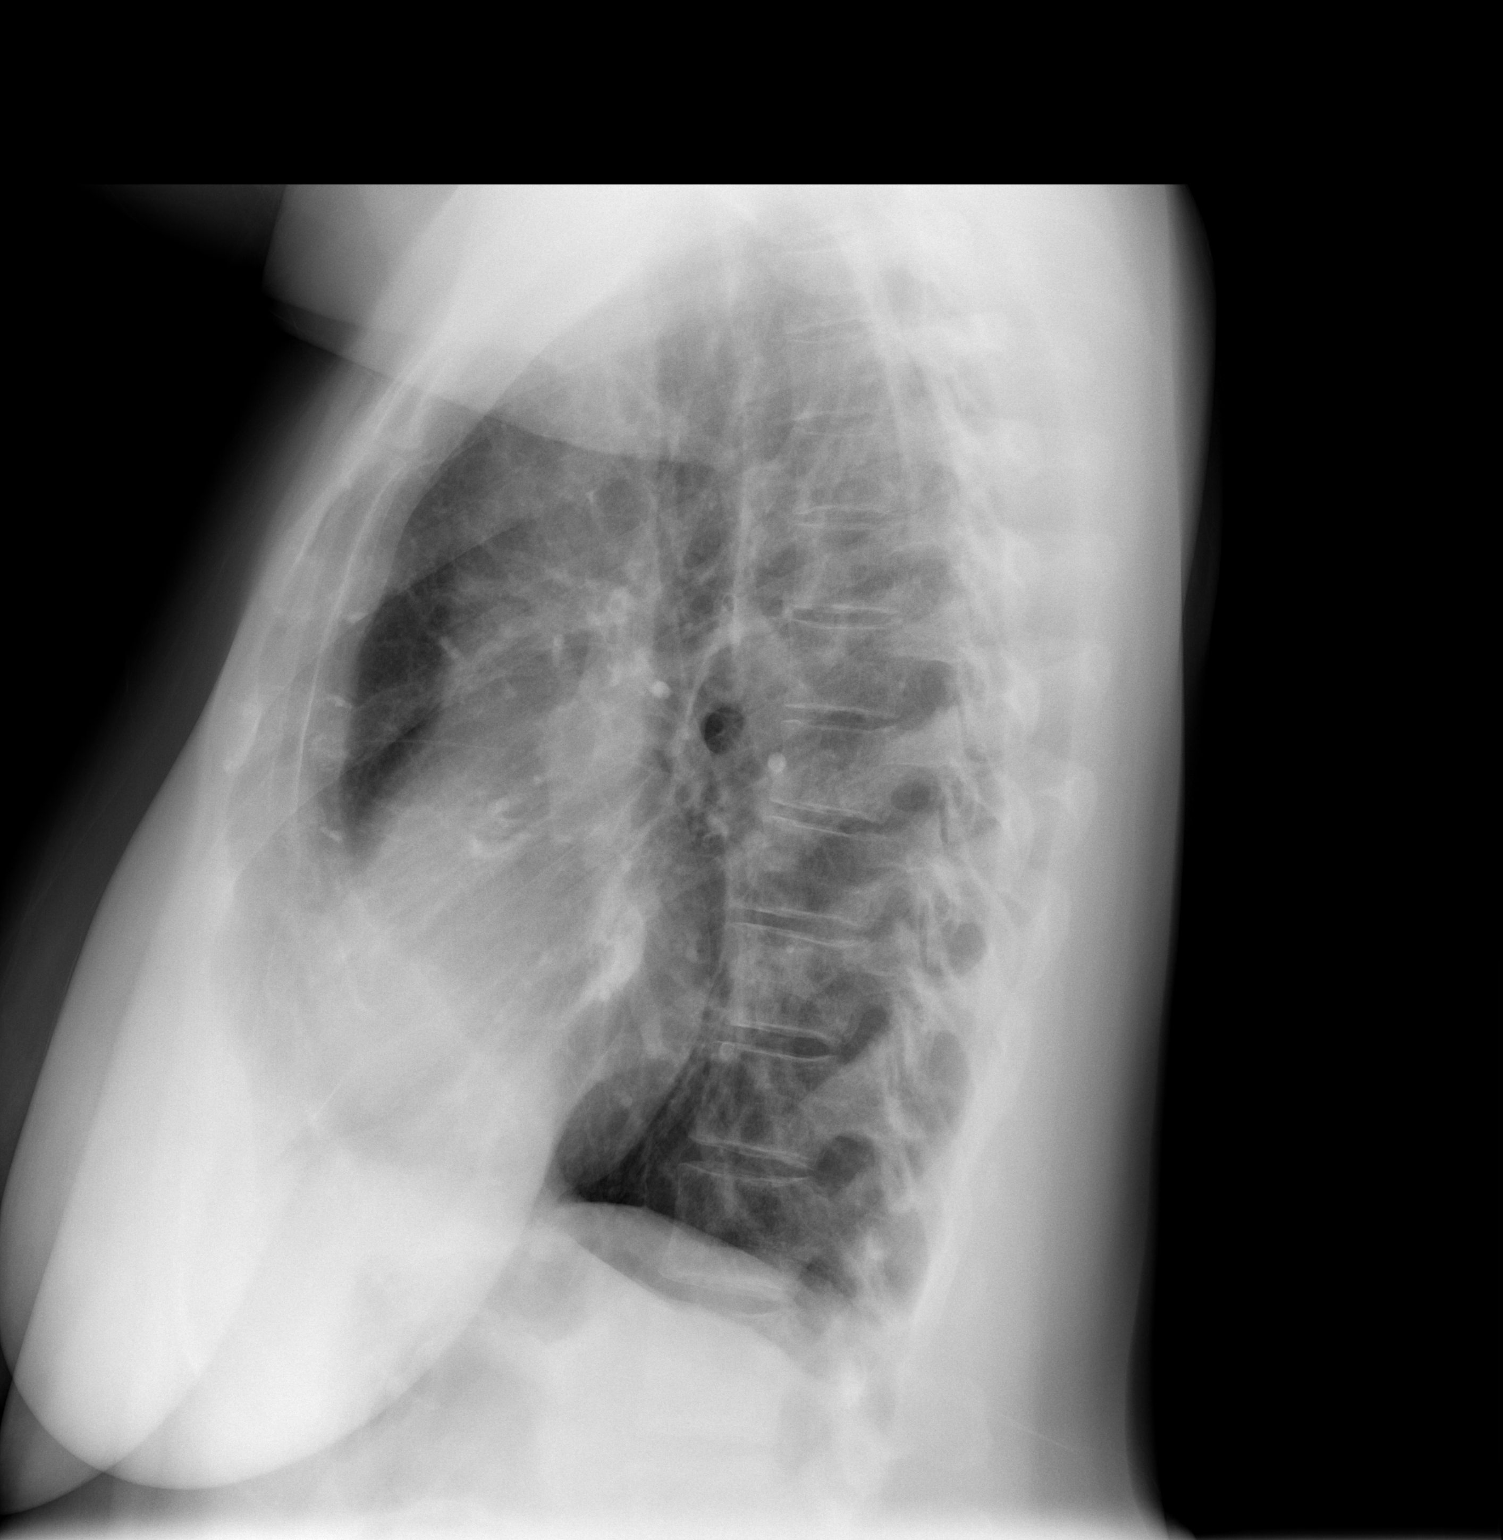

[2 of 2 positions shown; findings below may reference images not displayed]

FINDINGS: The heart remains normal in size. The lungs remain mildly
hyperexpanded with minimal central peribronchial thickening.
Unremarkable bones.
IMPRESSION: Stable mild changes of COPD and chronic bronchitis. No acute
abnormality.

## 2015-12-17 ENCOUNTER — Encounter (HOSPITAL_BASED_OUTPATIENT_CLINIC_OR_DEPARTMENT_OTHER): Payer: Self-pay | Admitting: Emergency Medicine

## 2015-12-17 ENCOUNTER — Emergency Department (HOSPITAL_BASED_OUTPATIENT_CLINIC_OR_DEPARTMENT_OTHER)
Admission: EM | Admit: 2015-12-17 | Discharge: 2015-12-17 | Disposition: A | Discharge status: Home / Self Care | Attending: Emergency Medicine | Admitting: Emergency Medicine

## 2015-12-17 DIAGNOSIS — R202 Paresthesia of skin: Secondary | ICD-10-CM | POA: Diagnosis not present

## 2015-12-17 DIAGNOSIS — Z7951 Long term (current) use of inhaled steroids: Secondary | ICD-10-CM | POA: Diagnosis not present

## 2015-12-17 DIAGNOSIS — F17210 Nicotine dependence, cigarettes, uncomplicated: Secondary | ICD-10-CM | POA: Insufficient documentation

## 2015-12-17 DIAGNOSIS — J449 Chronic obstructive pulmonary disease, unspecified: Secondary | ICD-10-CM | POA: Diagnosis not present

## 2015-12-17 DIAGNOSIS — Z880 Allergy status to penicillin: Secondary | ICD-10-CM | POA: Insufficient documentation

## 2015-12-17 DIAGNOSIS — K0889 Other specified disorders of teeth and supporting structures: Secondary | ICD-10-CM | POA: Insufficient documentation

## 2015-12-17 DIAGNOSIS — K029 Dental caries, unspecified: Secondary | ICD-10-CM | POA: Diagnosis not present

## 2015-12-17 DIAGNOSIS — Z87442 Personal history of urinary calculi: Secondary | ICD-10-CM | POA: Diagnosis not present

## 2015-12-17 DIAGNOSIS — Z7952 Long term (current) use of systemic steroids: Secondary | ICD-10-CM | POA: Insufficient documentation

## 2015-12-17 DIAGNOSIS — Z8701 Personal history of pneumonia (recurrent): Secondary | ICD-10-CM | POA: Diagnosis not present

## 2015-12-17 DIAGNOSIS — Z79899 Other long term (current) drug therapy: Secondary | ICD-10-CM | POA: Insufficient documentation

## 2015-12-17 MED ORDER — CLINDAMYCIN HCL 150 MG PO CAPS
300.0000 mg | ORAL_CAPSULE | Freq: Once | ORAL | Status: AC
  Administered 2015-12-17: 300 mg via ORAL
  Filled 2015-12-17: qty 2

## 2015-12-17 MED ORDER — CLINDAMYCIN HCL 300 MG PO CAPS: 300.0000 mg | ORAL_CAPSULE | Freq: Four times a day (QID) | ORAL | Status: DC

## 2015-12-17 MED ORDER — NAPROXEN 375 MG PO TABS: 375.0000 mg | ORAL_TABLET | Freq: Two times a day (BID) | ORAL | Status: DC

## 2015-12-17 NOTE — ED Notes (Signed)
Patient reports that she has pain to her left jaw and tooth. The patient also reports that she some numbness and tingling to her left jaw.

## 2015-12-17 NOTE — ED Provider Notes (Signed)
CSN: [id number]     Arrival date & time 12/17/15  0007 History  By signing my name below, I, Budd Palmer, attest that this documentation has been prepared under the direction and in the presence of Kino Dunsworth, MD. Electronically Signed: Budd Palmer, ED Scribe. 12/17/2015. 1:00 AM.    Chief Complaint  Patient presents with  . Dental Pain   Patient is a 50 y.o. female presenting with tooth pain. The history is provided by the patient. No language interpreter was used.  Dental Pain Location:  Lower Quality:  Aching Severity:  Moderate Onset quality:  Gradual Timing:  Constant Progression:  Worsening Chronicity:  New Relieved by:  Nothing Associated symptoms: facial pain   Associated symptoms: no congestion, no drooling, no facial swelling and no fever   Risk factors: smoking    HPI Comments: Kirsten Garcia is a 50 y.o. female smoker at 0.5 ppd who presents to the Emergency Department complaining of left-sided lower dental pain radiating into the left jaw onset 3 days ago. She reports associated numbness and tingling to the left jaw. She has not seen her dentist for this yet.  Pt is allergic to penicillins and sulfa antibiotics.   Past Medical History  Diagnosis Date  . Bronchitis, chronic (HCC)   . Pneumonia   . COPD (chronic obstructive pulmonary disease) (HCC)   . Kidney stone    Past Surgical History  Procedure Laterality Date  . Abdominal hysterectomy    . Cesarean section    . Lithotripsy     History reviewed. No pertinent family history. Social History  Substance Use Topics  . Smoking status: Current Every Day Smoker -- 0.50 packs/day    Types: Cigarettes  . Smokeless tobacco: Never Used  . Alcohol Use: No   OB History    No data available     Review of Systems  Constitutional: Negative for fever.  HENT: Positive for dental problem. Negative for congestion, drooling and facial swelling.   All other systems reviewed and are negative.   Allergies   Penicillins and Sulfa antibiotics  Home Medications   Prior to Admission medications   Medication Sig Start Date End Date Taking? Authorizing Provider  Tiotropium Bromide-Olodaterol (STIOLTO RESPIMAT) 2.5-2.5 MCG/ACT AERS Inhale 2.5 mcg into the lungs.   Yes Historical Provider, MD  ALBUTEROL IN Inhale into the lungs.    Historical Provider, MD  azithromycin (ZITHROMAX Z-PAK) 250 MG tablet Take 2 tablets now and then one tablet daily 09/11/14   [facility] Orlene Och, NP  chlorpheniramine-HYDROcodone [facility] ER) 10-8 MG/5ML LQCR Take 5 mLs by mouth every 12 (twelve) hours as needed for cough. 09/11/14   [facility] Orlene Och, NP  clindamycin (CLEOCIN) 300 MG capsule Take 1 capsule (300 mg total) by mouth 4 (four) times daily. X 7 days 12/17/15   Ramla Hase, MD  Fluticasone Furoate-Vilanterol (BREO ELLIPTA) 100-25 MCG/INH AEPB Inhale into the lungs.    Historical Provider, MD  naproxen (NAPROSYN) 375 MG tablet Take 1 tablet (375 mg total) by mouth 2 (two) times daily. 12/17/15   Hazyl Marseille, MD  predniSONE (DELTASONE) 10 MG tablet Take 2 tablets (20 mg total) by mouth daily. 09/11/14   [facility] Orlene Och, NP  tiotropium (SPIRIVA) 18 MCG inhalation capsule Place 18 mcg into inhaler and inhale daily.    Historical Provider, MD   BP 129/85 mmHg  Pulse 91  Temp(Src) 98.1 F (36.7 C)  Resp 18  Ht  (1.702 m)  Wt 195 lb (88.451  kg)  BMI 30.53 kg/m2  SpO2 98% Physical Exam  Constitutional: She is oriented to person, place, and time. She appears well-developed and well-nourished.  HENT:  Head: Normocephalic and atraumatic.  Mouth/Throat: Oropharynx is clear and moist. No trismus in the jaw. Dental caries present.    No trismus, jaw is stable, good sensation. Left lower canine tooth has a cavity visible below the gumline  Eyes: Conjunctivae are normal. Pupils are equal, round, and reactive to light. Right eye exhibits no discharge. Left eye exhibits no discharge.  Neck: Normal range of motion.  Neck supple. No tracheal deviation present.  Cardiovascular: Normal rate, regular rhythm and normal heart sounds.   Pulmonary/Chest: Effort normal and breath sounds normal. No stridor. No respiratory distress. She has no wheezes.  Abdominal: Soft. Bowel sounds are normal. There is no tenderness. There is no rebound.  Musculoskeletal: Normal range of motion.  Lymphadenopathy:    She has no cervical adenopathy.  Neurological: She is alert and oriented to person, place, and time. She has normal reflexes. Coordination normal.  Skin: Skin is warm and dry. No rash noted. She is not diaphoretic. No erythema.  Psychiatric: She has a normal mood and affect.  Nursing note and vitals reviewed.   ED Course  Procedures  DIAGNOSTIC STUDIES: Oxygen Saturation is 98% on RA, normal by my interpretation.    COORDINATION OF CARE: 12:51 AM - Discussed probable cavity below the gumline. Will order antibiotics. Will refer to dentist. Pt advised of plan for treatment and pt agrees.  Labs Review Labs Reviewed - No data to display  Imaging Review No results found. I have personally reviewed and evaluated these images and lab results as part of my medical decision-making.   EKG Interpretation None      MDM   Final diagnoses:  Pain, dental   Will treat with clindamycin and naproxen.  Follow up with your dentist for ongoing care.  Patient verbalizes understanding and agrees to follow up  I personally performed the services described in this documentation, which was scribed in my presence. The recorded information has been reviewed and is accurate.     Cy Blamer, MD 12/17/15 [phone]

## 2017-05-16 ENCOUNTER — Other Ambulatory Visit: Payer: Self-pay | Admitting: Nurse Practitioner

## 2017-05-16 DIAGNOSIS — G4452 New daily persistent headache (NDPH): Secondary | ICD-10-CM

## 2017-05-20 ENCOUNTER — Ambulatory Visit
Admission: RE | Admit: 2017-05-20 | Discharge: 2017-05-20 | Disposition: A | Discharge status: Home / Self Care | Source: Ambulatory Visit | Attending: Nurse Practitioner | Admitting: Nurse Practitioner

## 2017-05-20 DIAGNOSIS — G4452 New daily persistent headache (NDPH): Secondary | ICD-10-CM

## 2018-01-07 ENCOUNTER — Telehealth: Admitting: Nurse Practitioner

## 2018-01-07 DIAGNOSIS — J209 Acute bronchitis, unspecified: Secondary | ICD-10-CM

## 2018-01-07 DIAGNOSIS — J440 Chronic obstructive pulmonary disease with acute lower respiratory infection: Secondary | ICD-10-CM

## 2018-01-07 MED ORDER — LEVOFLOXACIN 500 MG PO TABS: 500.0000 mg | ORAL_TABLET | Freq: Every day | ORAL | 0 refills | Status: DC

## 2018-01-07 MED ORDER — PREDNISONE 10 MG (21) PO TBPK: ORAL_TABLET | ORAL | 0 refills | Status: DC

## 2018-01-07 NOTE — Progress Notes (Signed)
We are sorry that you are not feeling well.  Here is how we plan to help!  Based on your presentation I believe you most likely have A cough due to bacteria.  When patients have a fever and a productive cough with a change in color or increased sputum production, we are concerned about bacterial bronchitis.  If left untreated it can progress to pneumonia.  If your symptoms do not improve with your treatment plan it is important that you contact your provider.   I have prescribed Levofloxacin 500 mg daily for 7 days    In addition you may use A non-prescription cough medication called Mucinex DM: take 2 tablets every 12 hours.  Sterapred 10 mg dosepak  From your responses in the eVisit questionnaire you describe inflammation in the upper respiratory tract which is causing a significant cough.  This is commonly called Bronchitis and has four common causes:    Allergies  Viral Infections  Acid Reflux  Bacterial Infection Allergies, viruses and acid reflux are treated by controlling symptoms or eliminating the cause. An example might be a cough caused by taking certain blood pressure medications. You stop the cough by changing the medication. Another example might be a cough caused by acid reflux. Controlling the reflux helps control the cough.  USE OF BRONCHODILATOR ("RESCUE") INHALERS: There is a risk from using your bronchodilator too frequently.  The risk is that over-reliance on a medication which only relaxes the muscles surrounding the breathing tubes can reduce the effectiveness of medications prescribed to reduce swelling and congestion of the tubes themselves.  Although you feel brief relief from the bronchodilator inhaler, your asthma may actually be worsening with the tubes becoming more swollen and filled with mucus.  This can delay other crucial treatments, such as oral steroid medications. If you need to use a bronchodilator inhaler daily, several times per day, you should discuss this  with your provider.  There are probably better treatments that could be used to keep your asthma under control.     HOME CARE . Only take medications as instructed by your medical team. . Complete the entire course of an antibiotic. . Drink plenty of fluids and get plenty of rest. . Avoid close contacts especially the very young and the elderly . Cover your mouth if you cough or cough into your sleeve. . Always remember to wash your hands . A steam or ultrasonic humidifier can help congestion.   GET HELP RIGHT AWAY IF: . You develop worsening fever. . You become short of breath . You cough up blood. . Your symptoms persist after you have completed your treatment plan MAKE SURE YOU   Understand these instructions.  Will watch your condition.  Will get help right away if you are not doing well or get worse.  Your e-visit answers were reviewed by a board certified advanced clinical practitioner to complete your personal care plan.  Depending on the condition, your plan could have included both over the counter or prescription medications. If there is a problem please reply  once you have received a response from your provider. Your safety is important to us.  If you have drug allergies check your prescription carefully.    You can use MyChart to ask questions about today's visit, request a non-urgent call back, or ask for a work or school excuse for 24 hours related to this e-Visit. If it has been greater than 24 hours you will need to follow up with your   provider, or enter a new e-Visit to address those concerns. You will get an e-mail in the next two days asking about your experience.  I hope that your e-visit has been valuable and will speed your recovery. Thank you for using e-visits.    

## 2024-01-25 ENCOUNTER — Encounter (HOSPITAL_BASED_OUTPATIENT_CLINIC_OR_DEPARTMENT_OTHER): Payer: Self-pay | Admitting: Emergency Medicine

## 2024-01-25 ENCOUNTER — Ambulatory Visit (INDEPENDENT_AMBULATORY_CARE_PROVIDER_SITE_OTHER)
Admit: 2024-01-25 | Discharge: 2024-01-25 | Disposition: A | Discharge status: Home / Self Care | Payer: PRIVATE HEALTH INSURANCE | Attending: Family Medicine | Admitting: Family Medicine

## 2024-01-25 ENCOUNTER — Ambulatory Visit (HOSPITAL_BASED_OUTPATIENT_CLINIC_OR_DEPARTMENT_OTHER)
Admission: EM | Admit: 2024-01-25 | Discharge: 2024-01-25 | Disposition: A | Discharge status: Home / Self Care | Payer: PRIVATE HEALTH INSURANCE

## 2024-01-25 DIAGNOSIS — M7989 Other specified soft tissue disorders: Secondary | ICD-10-CM

## 2024-01-25 DIAGNOSIS — R2241 Localized swelling, mass and lump, right lower limb: Secondary | ICD-10-CM

## 2024-01-25 DIAGNOSIS — M79661 Pain in right lower leg: Secondary | ICD-10-CM

## 2024-01-25 DIAGNOSIS — M25571 Pain in right ankle and joints of right foot: Secondary | ICD-10-CM | POA: Diagnosis not present

## 2024-01-25 DIAGNOSIS — M79671 Pain in right foot: Secondary | ICD-10-CM

## 2024-01-25 DIAGNOSIS — S93401A Sprain of unspecified ligament of right ankle, initial encounter: Secondary | ICD-10-CM | POA: Diagnosis not present

## 2024-01-25 DIAGNOSIS — M25471 Effusion, right ankle: Secondary | ICD-10-CM

## 2024-01-25 NOTE — Discharge Instructions (Addendum)
 X-rays are negative.  She has a right ankle sprain.  Will supply an Ace bandage and an air splint.  Encouraged rest, ice, compression, elevation.  Use OTC ibuprofen or acetaminophen as needed for pain.  Work excuse provided.  Follow-up if symptoms do not improve, worsen or new symptoms occur.

## 2024-01-25 NOTE — ED Triage Notes (Signed)
 Pt states last night she went to stand up and her foot was numb but she continued to walk. As she was walking she fell and injured the top of her right foot and ankle. Pt took tylenol this morning for the pain.

## 2024-01-25 NOTE — ED Provider Notes (Signed)
 [facility]    CSN: [id number] Arrival date & time: 01/25/24  0808      History   Chief Complaint Chief Complaint  Patient presents with   Foot Pain    HPI Kirsten Garcia is a 58 y.o. female.   On 01/24/2024, she had been sitting at home with her right leg crossed over her left knee.  She stood up and her right foot and leg were numb.  She did not think anything of that as it happens often.  She started walking thinking it would improve as she walked but instead her ankle twisted and rolled inward and she fell sideways to the right.  She can only walk on it kind of with the left ball of her foot or the left side of her heel otherwise she cannot put weight on the foot and walk.  She has significant swelling on the right lateral midfoot and ankle with bruising and some swelling and bruising on the left side of the foot and ankle.  She has taken Tylenol this morning for pain.   Foot Pain Pertinent negatives include no chest pain and no abdominal pain.    Past Medical History:  Diagnosis Date   Bronchitis, chronic (HCC)    COPD (chronic obstructive pulmonary disease) (HCC)    Kidney stone    Pneumonia     There are no active problems to display for this patient.   Past Surgical History:  Procedure Laterality Date   ABDOMINAL HYSTERECTOMY     CESAREAN SECTION     LITHOTRIPSY      OB History   No obstetric history on file.      Home Medications    Prior to Admission medications   Medication Sig Start Date End Date Taking? Authorizing Provider  albuterol (VENTOLIN HFA) 108 (90 Base) MCG/ACT inhaler Inhale into the lungs. 02/24/15  Yes [provider]  aspirin EC 81 MG tablet Take 1 tablet by mouth daily. 05/28/22  Yes [provider]  budeson-glycopyrrolate-formoterol (BREZTRI AEROSPHERE) 160-9-4.8 MCG/ACT AERO Inhale into the lungs. 07/14/20  Yes [provider]  cetirizine (ZYRTEC) 10 MG tablet Take by mouth. 08/01/20  Yes [provider]  Ergocalciferol 10 MCG (400 UNIT) TABS Take by mouth. 09/06/22  Yes [provider]  gabapentin (NEURONTIN) 300 MG capsule Take 300 mg by mouth 3 (three) times daily.   Yes [provider]  omeprazole (PRILOSEC) 20 MG capsule Take 20 mg by mouth daily.   Yes [provider]  rosuvastatin (CRESTOR) 20 MG tablet Take 1 tablet by mouth daily. 05/28/22  Yes [provider]  tiotropium (SPIRIVA) 18 MCG inhalation capsule Place 18 mcg into inhaler and inhale daily.    [provider]    Family History History reviewed. No pertinent family history.  Social History Social History   Tobacco Use   Smoking status: Every Day    Current packs/day: 0.50    Types: Cigarettes   Smokeless tobacco: Never  Vaping Use   Vaping status: Never Used  Substance Use Topics   Alcohol use: No   Drug use: No     Allergies   Penicillins and Sulfa antibiotics   Review of Systems Review of Systems  Constitutional:  Negative for fever.  Respiratory:  Negative for cough.   Cardiovascular:  Negative for chest pain.  Gastrointestinal:  Negative for abdominal pain and vomiting.  Genitourinary:  Negative for hematuria.  Musculoskeletal:  Positive for joint swelling (right foot  and ankle pain and swelling). Negative for arthralgias and back pain.  Skin:  Negative for color change and rash.  Neurological:  Negative for syncope.  All other systems reviewed and are negative.    Physical Exam Triage Vital Signs ED Triage Vitals [01/25/24 0822]  Encounter Vitals Group     BP      Systolic BP Percentile      Diastolic BP Percentile      Pulse      Resp      Temp      Temp src      SpO2      Weight      Height      Head Circumference      Peak Flow      Pain Score 7     Pain Loc      Pain Education      Exclude from Growth Chart    No data found.  Updated Vital Signs BP 110/75 (BP Location: Right Arm)   Pulse 88   Temp 97.9 F (36.6  C) (Oral)   Resp 16   SpO2 96%   Visual Acuity Right Eye Distance:   Left Eye Distance:   Bilateral Distance:    Right Eye Near:   Left Eye Near:    Bilateral Near:     Physical Exam Vitals and nursing note reviewed.  Constitutional:      General: She is not in acute distress.    Appearance: She is well-developed. She is not ill-appearing or toxic-appearing.  HENT:     Head: Normocephalic and atraumatic.     Right Ear: External ear normal.     Left Ear: External ear normal.     Nose: Nose normal.     Mouth/Throat:     Lips: Pink.     Mouth: Mucous membranes are moist.  Eyes:     Conjunctiva/sclera: Conjunctivae normal.     Pupils: Pupils are equal, round, and reactive to light.  Cardiovascular:     Rate and Rhythm: Normal rate and regular rhythm.     Heart sounds: S1 normal and S2 normal. No murmur heard. Pulmonary:     Effort: Pulmonary effort is normal. No respiratory distress.     Breath sounds: Normal breath sounds. No decreased breath sounds, wheezing, rhonchi or rales.  Musculoskeletal:        General: No swelling.     Right lower leg: Tenderness (near/above the ankle) present. 1+ Edema (above the ankle) present.     Left lower leg: Normal.     Right ankle: Swelling (Mostly around the lateral malleolus but some noted at the medial malleolus.) and ecchymosis (Mostly around the lateral malleolus but some noted at the medial malleolus.) present. Tenderness (Mostly around the lateral malleolus but some noted at the medial malleolus.) present. Decreased range of motion (Due to pain.  Decreased supination and pronation.).     Right Achilles Tendon: Normal.     Left ankle: Normal.     Left Achilles Tendon: Normal.     Right foot: Swelling (Lateral midfoot) and tenderness (Lateral midfoot with some ecchymosis.) present.     Left foot: Normal.  Skin:    General: Skin is warm and dry.     Capillary Refill: Capillary refill takes less than 2 seconds.     Findings: No rash.   Neurological:     Mental Status: She is alert and oriented to person, place, and time.  Psychiatric:  Mood and Affect: Mood normal.      UC Treatments / Results  Labs (all labs ordered are listed, but only abnormal results are displayed) Labs Reviewed - No data to display  EKG   Radiology DG Foot Complete Right Result Date: 01/25/2024 CLINICAL DATA:  Numbness, fall, pain. EXAM: RIGHT FOOT COMPLETE - 3+ VIEW COMPARISON:  None Available. FINDINGS: No acute osseous abnormality. There may be a small ankle joint effusion. Calcaneal spurs. IMPRESSION: No acute osseous abnormality. There may be a small ankle joint effusion. Electronically Signed   By: Leanna Battles M.D.   On: 01/25/2024 09:31   DG Tibia/Fibula Right Result Date: 01/25/2024 CLINICAL DATA:  Numbness, fall, injury, pain. EXAM: RIGHT TIBIA AND FIBULA - 2 VIEW COMPARISON:  None Available. FINDINGS: No acute osseous abnormality.  Calcaneal spurs. IMPRESSION: No acute osseous abnormality. Electronically Signed   By: Leanna Battles M.D.   On: 01/25/2024 09:30    Procedures Procedures (including critical care time)  Medications Ordered in UC Medications - No data to display  Initial Impression / Assessment and Plan / UC Course  I have reviewed the triage vital signs and the nursing notes.  Pertinent labs & imaging results that were available during my care of the patient were reviewed by me and considered in my medical decision making (see chart for details).     Right ankle sprain: Negative x-rays.  Use an Ace bandage and air splint.  Encouraged RICE therapy.  Use OTC ibuprofen or acetaminophen for pain.  Work excuse provided.  Follow-up if symptoms do not improve, worsen or new symptoms occur. Final Clinical Impressions(s) / UC Diagnoses   Final diagnoses:  Right foot pain  Acute right ankle pain  Pain and swelling of right lower leg  Localized swelling of right foot  Right ankle swelling  Sprain of right  ankle, unspecified ligament, initial encounter     Discharge Instructions      X-rays are negative.  She has a right ankle sprain.  Will supply an Ace bandage and an air splint.  Encouraged rest, ice, compression, elevation.  Use OTC ibuprofen or acetaminophen as needed for pain.  Work excuse provided.  Follow-up if symptoms do not improve, worsen or new symptoms occur.     ED Prescriptions   None    PDMP not reviewed this encounter.   Prescilla Sours, FNP 01/25/24 [phone]
